# Patient Record
Sex: Female | Born: 2000 | Race: Black or African American | Hispanic: No | Marital: Single | Smoking: Never smoker
Health system: Southern US, Community
[De-identification: ages and names within clinical notes are randomized; demographics above are authoritative.]

---
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
## 2020-04-28 ENCOUNTER — Encounter (HOSPITAL_COMMUNITY): Payer: Self-pay | Admitting: *Deleted

## 2020-04-28 ENCOUNTER — Other Ambulatory Visit: Payer: Self-pay

## 2020-04-28 ENCOUNTER — Ambulatory Visit (HOSPITAL_COMMUNITY)
Admission: EM | Admit: 2020-04-28 | Discharge: 2020-04-28 | Disposition: A | Discharge status: Home / Self Care | Payer: Medicaid Other | Attending: Family Medicine | Admitting: Family Medicine

## 2020-04-28 DIAGNOSIS — Z113 Encounter for screening for infections with a predominantly sexual mode of transmission: Secondary | ICD-10-CM

## 2020-04-28 LAB — POCT URINALYSIS DIPSTICK, ED / UC
Bilirubin Urine: NEGATIVE
Glucose, UA: NEGATIVE mg/dL
Nitrite: NEGATIVE
Protein, ur: NEGATIVE mg/dL
Specific Gravity, Urine: 1.025 (ref 1.005–1.030)
Urobilinogen, UA: 0.2 mg/dL (ref 0.0–1.0)
pH: 6.5 (ref 5.0–8.0)

## 2020-04-28 LAB — POC URINE PREG, ED: Preg Test, Ur: NEGATIVE

## 2020-04-28 LAB — HIV ANTIBODY (ROUTINE TESTING W REFLEX): HIV Screen 4th Generation wRfx: NONREACTIVE

## 2020-04-28 NOTE — ED Provider Notes (Signed)
[facility]   [id number] 04/28/20 Arrival Time: 6144  ASSESSMENT & PLAN:  1. Screening for STDs (sexually transmitted diseases)       Discharge Instructions     We have sent testing for sexually transmitted infections. We will notify you of any positive results once they are received. If required, we will prescribe any medications you might need.  Please refrain from all sexual activity for at least the next seven days.     Without s/s of PID.  Labs Reviewed  POCT URINALYSIS DIPSTICK, ED / UC - Abnormal; Notable for the following components:      Result Value   Ketones, ur TRACE (*)    Hgb urine dipstick TRACE (*)    Leukocytes,Ua TRACE (*)    All other components within normal limits  RPR  HIV ANTIBODY (ROUTINE TESTING W REFLEX)  POC URINE PREG, ED  CERVICOVAGINAL ANCILLARY ONLY   Urine culture also sent. Will notify of any positive results. Instructed to refrain from sexual activity for at least seven days.  Reviewed expectations re: course of current medical issues. Questions answered. Outlined signs and symptoms indicating need for more acute intervention. Patient verbalized understanding. After Visit Summary given.   SUBJECTIVE:  Stephanie Bullock is a 19 y.o. female who requests STD screening. No symptoms. Afebrile. No abdominal or pelvic pain. Normal PO intake wihout n/v. No genital rashes or lesions.  Patient's last menstrual period was 04/28/2020.   OBJECTIVE:  Vitals:   04/28/20 1034 04/28/20 1040  BP: 130/77   Pulse: 84   Resp: 16   Temp: 98.6 F (37 C)   TempSrc: Oral   SpO2: 100%   Weight:  115.7 kg  Height:  5\' 8"  (1.727 m)     General appearance: alert, cooperative, appears stated age and no distress Lungs: unlabored respirations; speaks full sentences without difficulty Back: no CVA tenderness; FROM at waist Abdomen: soft, non-tender GU: deferred Skin: warm and dry Psychological: alert and cooperative; normal mood and  affect.    Labs Reviewed  CERVICOVAGINAL ANCILLARY ONLY    No Known Allergies  History reviewed. No pertinent past medical history. Family History  Problem Relation Age of Onset   Healthy Mother    Social History   Socioeconomic History   Marital status: Single    Spouse name: Not on file   Number of children: Not on file   Years of education: Not on file   Highest education level: Not on file  Occupational History   Not on file  Tobacco Use   Smoking status: Never Smoker   Smokeless tobacco: Never Used  Substance and Sexual Activity   Alcohol use: Never   Drug use: Never   Sexual activity: Not on file  Other Topics Concern   Not on file  Social History Narrative   Not on file   Social Determinants of Health   Financial Resource Strain:    Difficulty of Paying Living Expenses: Not on file  Food Insecurity:    Worried About Running Out of Food in the Last Year: Not on file   Ran Out of Food in the Last Year: Not on file  Transportation Needs:    Lack of Transportation (Medical): Not on file   Lack of Transportation (Non-Medical): Not on file  Physical Activity:    Days of Exercise per Week: Not on file   Minutes of Exercise per Session: Not on file  Stress:    Feeling of Stress : Not on  file  Social Connections:    Frequency of Communication with Friends and Family: Not on file   Frequency of Social Gatherings with Friends and Family: Not on file   Attends Religious Services: Not on file   Active Member of Clubs or Organizations: Not on file   Attends Banker Meetings: Not on file   Marital Status: Not on file  Intimate Partner Violence:    Fear of Current or Ex-Partner: Not on file   Emotionally Abused: Not on file   Physically Abused: Not on file   Sexually Abused: Not on file          [place], MD 04/28/20 1132

## 2020-04-28 NOTE — Discharge Instructions (Signed)
We have sent testing for sexually transmitted infections. We will notify you of any positive results once they are received. If required, we will prescribe any medications you might need.  Please refrain from all sexual activity for at least the next seven days.  

## 2020-04-28 NOTE — ED Triage Notes (Signed)
PT has no SX's just wants a STD check.

## 2020-04-29 ENCOUNTER — Telehealth (HOSPITAL_COMMUNITY): Payer: Self-pay | Admitting: Emergency Medicine

## 2020-04-29 LAB — URINE CULTURE

## 2020-04-29 LAB — CERVICOVAGINAL ANCILLARY ONLY
Bacterial Vaginitis (gardnerella): NEGATIVE
Candida Glabrata: NEGATIVE
Candida Vaginitis: POSITIVE — AB
Chlamydia: POSITIVE — AB
Comment: NEGATIVE
Comment: NEGATIVE
Comment: NEGATIVE
Comment: NEGATIVE
Comment: NEGATIVE
Comment: NORMAL
Neisseria Gonorrhea: NEGATIVE
Trichomonas: NEGATIVE

## 2020-04-29 LAB — RPR: RPR Ser Ql: NONREACTIVE

## 2020-04-29 MED ORDER — FLUCONAZOLE 150 MG PO TABS: 150.0000 mg | ORAL_TABLET | Freq: Once | ORAL | 0 refills | Status: AC

## 2020-04-29 MED ORDER — AZITHROMYCIN 250 MG PO TABS: 1000.0000 mg | ORAL_TABLET | Freq: Once | ORAL | 0 refills | Status: AC

## 2020-05-03 NOTE — Telephone Encounter (Signed)
Prescriptions did not go through, called and left voicemail for verbals

## 2020-06-24 ENCOUNTER — Ambulatory Visit (HOSPITAL_COMMUNITY)
Admission: EM | Admit: 2020-06-24 | Discharge: 2020-06-24 | Disposition: A | Discharge status: Home / Self Care | Payer: Medicaid Other | Attending: Emergency Medicine | Admitting: Emergency Medicine

## 2020-06-24 ENCOUNTER — Encounter (HOSPITAL_COMMUNITY): Payer: Self-pay | Admitting: Emergency Medicine

## 2020-06-24 ENCOUNTER — Other Ambulatory Visit: Payer: Self-pay

## 2020-06-24 DIAGNOSIS — M79672 Pain in left foot: Secondary | ICD-10-CM | POA: Diagnosis not present

## 2020-06-24 MED ORDER — IBUPROFEN 600 MG PO TABS: 600.0000 mg | ORAL_TABLET | Freq: Four times a day (QID) | ORAL | 0 refills | Status: AC | PRN

## 2020-06-24 NOTE — ED Provider Notes (Signed)
[facility]    CSN: [id number] Arrival date & time: 06/24/20  0932      History   Chief Complaint Chief Complaint  Patient presents with  . Foot Pain    HPI Stephanie Bullock is a 19 y.o. female.   Patient presents with pain in her left foot x2 weeks.  No falls or injury.  She states the pain is currently 4/10, worse with extension of her foot and ambulation, improves with rest, nonradiating.  She denies fever, chills, rash, lesions, numbness, weakness, paresthesias, or other symptoms.  Treatment attempted at home with Tylenol.  The history is provided by the patient.    History reviewed. No pertinent past medical history.  There are no problems to display for this patient.   History reviewed. No pertinent surgical history.  OB History   No obstetric history on file.      Home Medications    Prior to Admission medications   Medication Sig Start Date End Date Taking? Authorizing Provider  ibuprofen (ADVIL) 600 MG tablet Take 1 tablet (600 mg total) by mouth every 6 (six) hours as needed. 06/24/20   Mickie Bail, NP  Topiramate (TOPAMAX PO) Take by mouth.  04/28/20  [provider]    Family History Family History  Problem Relation Age of Onset  . Healthy Mother     Social History Social History   Tobacco Use  . Smoking status: Never Smoker  . Smokeless tobacco: Never Used  Substance Use Topics  . Alcohol use: Never  . Drug use: Never     Allergies   Patient has no known allergies.   Review of Systems Review of Systems  Constitutional: Negative for chills and fever.  HENT: Negative for ear pain and sore throat.   Eyes: Negative for pain and visual disturbance.  Respiratory: Negative for cough and shortness of breath.   Cardiovascular: Negative for chest pain and palpitations.  Gastrointestinal: Negative for abdominal pain and vomiting.  Genitourinary: Negative for dysuria and hematuria.  Musculoskeletal: Positive for  arthralgias. Negative for back pain.  Skin: Negative for color change and rash.  Neurological: Negative for seizures, syncope, weakness and numbness.  All other systems reviewed and are negative.    Physical Exam Triage Vital Signs ED Triage Vitals  Enc Vitals Group     BP 06/24/20 1034 113/66     Pulse Rate 06/24/20 1034 72     Resp 06/24/20 1034 14     Temp 06/24/20 1034 98.2 F (36.8 C)     Temp Source 06/24/20 1034 Oral     SpO2 06/24/20 1034 100 %     Weight 06/24/20 1032 248 lb (112.5 kg)     Height 06/24/20 1032 5\' 8"  (1.727 m)     Head Circumference --      Peak Flow --      Pain Score 06/24/20 1032 4     Pain Loc --      Pain Edu? --      Excl. in GC? --    No data found.  Updated Vital Signs BP 113/66 (BP Location: Left Arm)   Pulse 72   Temp 98.2 F (36.8 C) (Oral)   Resp 14   Ht 5\' 8"  (1.727 m)   Wt 248 lb (112.5 kg)   LMP 06/09/2020   SpO2 100%   BMI 37.71 kg/m   Visual Acuity Right Eye Distance:   Left Eye Distance:   Bilateral Distance:  Right Eye Near:   Left Eye Near:    Bilateral Near:     Physical Exam Vitals and nursing note reviewed.  Constitutional:      General: She is not in acute distress.    Appearance: She is well-developed and well-nourished. She is not ill-appearing.  HENT:     Head: Normocephalic and atraumatic.     Mouth/Throat:     Mouth: Mucous membranes are moist.  Eyes:     Conjunctiva/sclera: Conjunctivae normal.  Cardiovascular:     Rate and Rhythm: Normal rate and regular rhythm.     Heart sounds: Normal heart sounds.  Pulmonary:     Effort: Pulmonary effort is normal. No respiratory distress.     Breath sounds: Normal breath sounds.  Abdominal:     Palpations: Abdomen is soft.     Tenderness: There is no abdominal tenderness.  Musculoskeletal:        General: Tenderness present. No swelling, deformity or edema. Normal range of motion.     Cervical back: Neck supple.       Feet:  Skin:    General:  Skin is warm and dry.     Capillary Refill: Capillary refill takes less than 2 seconds.     Findings: No bruising, erythema, lesion or rash.  Neurological:     General: No focal deficit present.     Mental Status: She is alert and oriented to person, place, and time.     Sensory: No sensory deficit.     Motor: No weakness.     Coordination: Coordination normal.     Gait: Gait normal.  Psychiatric:        Mood and Affect: Mood and affect and mood normal.        Behavior: Behavior normal.      UC Treatments / Results  Labs (all labs ordered are listed, but only abnormal results are displayed) Labs Reviewed - No data to display  EKG   Radiology No results found.  Procedures Procedures (including critical care time)  Medications Ordered in UC Medications - No data to display  Initial Impression / Assessment and Plan / UC Course  I have reviewed the triage vital signs and the nursing notes.  Pertinent labs & imaging results that were available during my care of the patient were reviewed by me and considered in my medical decision making (see chart for details).   Left foot pain.  Treating with ibuprofen, rest, elevation, ice packs, Ace wrap.  Instructed patient to follow-up with an orthopedist if her symptoms are not improving.  She agrees to plan of care.   Final Clinical Impressions(s) / UC Diagnoses   Final diagnoses:  Foot pain, left     Discharge Instructions     Take the ibuprofen as prescribed.  Rest and elevate your foot.  Apply ice packs 2-3 times a day for up to 20 minutes each.  Wear the ace wrap as needed for comfort.    Follow up with an orthopedist if you symptoms are not improving.       ED Prescriptions    Medication Sig Dispense Auth. Provider   ibuprofen (ADVIL) 600 MG tablet Take 1 tablet (600 mg total) by mouth every 6 (six) hours as needed. 30 tablet Mickie Bail, NP     PDMP not reviewed this encounter.   Mickie Bail, NP 06/24/20  1110

## 2020-06-24 NOTE — Discharge Instructions (Signed)
Take the ibuprofen as prescribed.  Rest and elevate your foot.  Apply ice packs 2-3 times a day for up to 20 minutes each.  Wear the ace wrap as needed for comfort.    Follow up with an orthopedist if you symptoms are not improving.

## 2020-06-24 NOTE — ED Triage Notes (Addendum)
Patient c/o LFT foot pain x 2 weeks.   Patient endorses pain upon ambulation. Patient is able to ambulate with a steady gate.   Patient endorses "having some falls recently".   Patient has taken Tylenol and Ice Pack w/ no relief of symptoms.   History of Ankle Sprain 3 years ago.

## 2020-10-27 DIAGNOSIS — F4323 Adjustment disorder with mixed anxiety and depressed mood: Secondary | ICD-10-CM | POA: Diagnosis not present

## 2020-11-03 DIAGNOSIS — F4323 Adjustment disorder with mixed anxiety and depressed mood: Secondary | ICD-10-CM | POA: Diagnosis not present

## 2020-11-10 DIAGNOSIS — F4323 Adjustment disorder with mixed anxiety and depressed mood: Secondary | ICD-10-CM | POA: Diagnosis not present

## 2020-11-17 DIAGNOSIS — F4323 Adjustment disorder with mixed anxiety and depressed mood: Secondary | ICD-10-CM | POA: Diagnosis not present
